# Patient Record
Sex: Male | Born: 1980 | Race: White | Hispanic: No | Marital: Married | Smoking: Never smoker
Health system: Southern US, Community
[De-identification: ages and names within clinical notes are randomized; demographics above are authoritative.]

## PROBLEM LIST (undated history)

## (undated) DIAGNOSIS — J309 Allergic rhinitis, unspecified: Secondary | ICD-10-CM

## (undated) DIAGNOSIS — K219 Gastro-esophageal reflux disease without esophagitis: Secondary | ICD-10-CM

## (undated) DIAGNOSIS — T7840XA Allergy, unspecified, initial encounter: Secondary | ICD-10-CM

## (undated) HISTORY — DX: Gastro-esophageal reflux disease without esophagitis: K21.9

## (undated) HISTORY — PX: HERNIA REPAIR: SHX51

## (undated) HISTORY — DX: Allergy, unspecified, initial encounter: T78.40XA

## (undated) HISTORY — DX: Allergic rhinitis, unspecified: J30.9

## (undated) HISTORY — PX: ROTATOR CUFF REPAIR: SHX139

---

## 2016-09-30 ENCOUNTER — Emergency Department (HOSPITAL_BASED_OUTPATIENT_CLINIC_OR_DEPARTMENT_OTHER)
Admission: EM | Admit: 2016-09-30 | Discharge: 2016-09-30 | Disposition: A | Discharge status: Home / Self Care | Payer: No Typology Code available for payment source | Attending: Emergency Medicine | Admitting: Emergency Medicine

## 2016-09-30 ENCOUNTER — Emergency Department (HOSPITAL_BASED_OUTPATIENT_CLINIC_OR_DEPARTMENT_OTHER): Payer: No Typology Code available for payment source

## 2016-09-30 ENCOUNTER — Encounter (HOSPITAL_BASED_OUTPATIENT_CLINIC_OR_DEPARTMENT_OTHER): Payer: Self-pay | Admitting: Emergency Medicine

## 2016-09-30 DIAGNOSIS — S161XXA Strain of muscle, fascia and tendon at neck level, initial encounter: Secondary | ICD-10-CM | POA: Diagnosis not present

## 2016-09-30 DIAGNOSIS — Y92410 Unspecified street and highway as the place of occurrence of the external cause: Secondary | ICD-10-CM | POA: Insufficient documentation

## 2016-09-30 DIAGNOSIS — S39012A Strain of muscle, fascia and tendon of lower back, initial encounter: Secondary | ICD-10-CM | POA: Insufficient documentation

## 2016-09-30 DIAGNOSIS — Y9389 Activity, other specified: Secondary | ICD-10-CM | POA: Insufficient documentation

## 2016-09-30 DIAGNOSIS — Y999 Unspecified external cause status: Secondary | ICD-10-CM | POA: Diagnosis not present

## 2016-09-30 DIAGNOSIS — S199XXA Unspecified injury of neck, initial encounter: Secondary | ICD-10-CM | POA: Diagnosis present

## 2016-09-30 MED ORDER — CYCLOBENZAPRINE HCL 10 MG PO TABS: 10.0000 mg | ORAL_TABLET | Freq: Two times a day (BID) | ORAL | 0 refills | Status: DC | PRN

## 2016-09-30 NOTE — ED Notes (Signed)
Bacitracin dressing to his left wrist
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
## 2016-09-30 NOTE — Discharge Instructions (Signed)
Ibuprofen 600 mg every 6 hours as needed for pain.  Flexeril as prescribed as needed for pain not relieved with ibuprofen.  All with your primary Dr. if you're not improving in the next week.
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
## 2024-07-11 NOTE — Progress Notes (Unsigned)
 "     Office Note 07/12/2024  CC:  Chief Complaint  Patient presents with   Annual Exam    Pt is fasting   HPI:  Eddie Whitney is a 44 y.o. male who is here to establish care, cpe. Patient's most recent primary MD: None Old records were not available for review prior to or during today's visit.  Brown spot on left side of scalp getting darker lately.  He notes his resting heart rate is increased to the 90-105 range a couple of times a month.  No palpitations, shortness of breath, dizziness, or chest pains.  On 1 of these occasions he was sitting in church and noticed on his watch that his rate was 104 and he felt a vague sick feeling but it was brief.  He does not do any formal exercise, says his work hours are too long, no time. His diet is fairly healthy. He owns a therapist, music.  He has a skin tag in the left armpit and 1 on the right upper inner thigh that he would like removed.  He has several years of gradually progressive gastroesophageal reflux.  A couple of nights a week he wakes up in the night with significant symptoms.  He takes over-the-counter Pepcid 10 mg twice a day usually.  Sometimes takes Tums (has a bottle of it by his bedside). He does identify some foods as culprits, also says when he eats later in the evening it happens more.  Past Medical History:  Diagnosis Date   Allergic rhinitis    GERD (gastroesophageal reflux disease)     Past Surgical History:  Procedure Laterality Date   HERNIA REPAIR     ROTATOR CUFF REPAIR     2015    History reviewed. No pertinent family history.  Social History   Socioeconomic History   Marital status: Married    Spouse name: Not on file   Number of children: Not on file   Years of education: Not on file   Highest education level: Bachelor's degree (e.g., BA, AB, BS)  Occupational History   Not on file  Tobacco Use   Smoking status: Never   Smokeless tobacco: Never  Substance and Sexual Activity   Alcohol  use: No   Drug use: No   Sexual activity: Not on file  Other Topics Concern   Not on file  Social History Narrative   Married,   Financial Controller   Social Drivers of Health   Tobacco Use: Low Risk (07/12/2024)   Patient History    Smoking Tobacco Use: Never    Smokeless Tobacco Use: Never    Passive Exposure: Not on file  Financial Resource Strain: Low Risk (07/08/2024)   Overall Financial Resource Strain (CARDIA)    Difficulty of Paying Living Expenses: Not hard at all  Food Insecurity: No Food Insecurity (07/08/2024)   Epic    Worried About Radiation Protection Practitioner of Food in the Last Year: Never true    Ran Out of Food in the Last Year: Never true  Transportation Needs: No Transportation Needs (07/08/2024)   Epic    Lack of Transportation (Medical): No    Lack of Transportation (Non-Medical): No  Physical Activity: Inactive (07/08/2024)   Exercise Vital Sign    Days of Exercise per Week: 0 days    Minutes of Exercise per Session: Not on file  Stress: No Stress Concern Present (07/08/2024)   [employer] of Occupational Health - Occupational Stress Questionnaire  Feeling of Stress: Only a little  Social Connections: Socially Integrated (07/08/2024)   Social Connection and Isolation Panel    Frequency of Communication with Friends and Family: Twice a week    Frequency of Social Gatherings with Friends and Family: Once a week    Attends Religious Services: More than 4 times per year    Active Member of Clubs or Organizations: Yes    Attends Banker Meetings: More than 4 times per year    Marital Status: Married  Catering Manager Violence: Not on file  Depression (PHQ2-9): Low Risk (07/12/2024)   Depression (PHQ2-9)    PHQ-2 Score: 0  Alcohol Screen: Low Risk (07/08/2024)   Alcohol Screen    Last Alcohol Screening Score (AUDIT): 1  Housing: Low Risk (07/08/2024)   Epic    Unable to Pay for Housing in the Last Year: No    Number of Times Moved in the Last  Year: 0    Homeless in the Last Year: No  Utilities: Not on file  Health Literacy: Not on file    Outpatient Encounter Medications as of 07/12/2024  Medication Sig   famotidine (PEPCID) 10 MG tablet Take 10 mg by mouth 2 (two) times daily.   loratadine-pseudoephedrine (CLARITIN-D 12-HOUR) 5-120 MG tablet Take 1 tablet by mouth 2 (two) times daily.   omeprazole (PRILOSEC) 40 MG capsule Take 1 capsule (40 mg total) by mouth daily as needed.   [DISCONTINUED] fexofenadine (ALLEGRA) 180 MG tablet Take 180 mg by mouth daily.   [DISCONTINUED] cyclobenzaprine  (FLEXERIL ) 10 MG tablet Take 1 tablet (10 mg total) by mouth 2 (two) times daily as needed for muscle spasms.   No facility-administered encounter medications on file as of 07/12/2024.    Allergies[1]  Review of Systems  Constitutional:  Negative for appetite change, chills, fatigue and fever.  HENT:  Negative for congestion, dental problem, ear pain and sore throat.   Eyes:  Negative for discharge, redness and visual disturbance.  Respiratory:  Negative for cough, chest tightness, shortness of breath and wheezing.   Cardiovascular:  Negative for chest pain, palpitations and leg swelling.  Gastrointestinal:  Negative for abdominal pain, blood in stool, diarrhea, nausea and vomiting.  Genitourinary:  Negative for difficulty urinating, dysuria, flank pain, frequency, hematuria and urgency.  Musculoskeletal:  Negative for arthralgias, back pain, joint swelling, myalgias and neck stiffness.  Skin:  Negative for pallor and rash.  Neurological:  Negative for dizziness, speech difficulty, weakness and headaches.  Hematological:  Negative for adenopathy. Does not bruise/bleed easily.  Psychiatric/Behavioral:  Negative for confusion and sleep disturbance. The patient is not nervous/anxious.     PE; Blood pressure 113/78, pulse 72, temperature 98 F (36.7 C), height 5' 9.69 (1.77 m), weight 207 lb 3.2 oz (94 kg), SpO2 98%. Body mass index is 30  kg/m.  Physical Exam  Gen: Alert, well appearing.  Patient is oriented to person, place, time, and situation. AFFECT: pleasant, lucid thought and speech. ENT: Ears: EACs clear, normal epithelium.  TMs with good light reflex and landmarks bilaterally.  Eyes: no injection, icteris, swelling, or exudate.  EOMI, PERRLA. Nose: no drainage or turbinate edema/swelling.  No injection or focal lesion.  Mouth: lips without lesion/swelling.  Oral mucosa pink and moist.  Dentition intact and without obvious caries or gingival swelling.  Oropharynx without erythema, exudate, or swelling.  Neck: supple/nontender.  No LAD, mass, or TM.  Carotid pulses 2+ bilaterally, without bruits. CV: RRR, no m/r/g.   LUNGS: CTA  bilat, nonlabored resps, good aeration in all lung fields. ABD: soft, NT, ND, BS normal.  No hepatospenomegaly or mass.  No bruits. EXT: no clubbing, cyanosis, or edema.  Musculoskeletal: no joint swelling, erythema, warmth, or tenderness.  ROM of all joints intact. Skin -left frontal scalp with light brown irregular shaped macule about 3 mm in size. Small skin tag in left armpit.  Approximately 1 cm skin tag in right groin crease.  Pertinent labs:  None  ASSESSMENT AND PLAN:   New patient, establishing care.  #1 health maintenance exam: Reviewed age and gender appropriate health maintenance issues (prudent diet, regular exercise, health risks of tobacco and excessive alcohol, use of seatbelts, fire alarms in home, use of sunscreen).  Also reviewed age and gender appropriate health screening as well as vaccine recommendations. Vaccines: Tdap-->utd 2017 Labs: fasting HP labs ordered Prostate ca screening: average risk patient= as per latest guidelines, start screening at 50 yrs of age. Colon ca screening: average risk patient= as per latest guidelines, start screening at 64 yrs of age.  Discussed options today-->GI referral today.  #2 Pigmented skin lesion of scalp/needs skin cancer  screening. Refer to dermatology.  3.  Increased resting heart rate. Reassured.  Monitor. Check TSH today.  #4 skin tags.  He will return for the removal of the left axillary skin tag in the left groin crease skin tag.  #5 GERD. Start omeprazole 40 mg daily as needed.  Okay to stop H2 blocker.  An After Visit Summary was printed and given to the patient.  Return for appt at your convenience for skin tag removal.  Signed:  Gerlene Hockey, MD           07/12/2024      [1]  Allergies Allergen Reactions   Codeine Rash   "

## 2024-07-12 ENCOUNTER — Encounter: Payer: Self-pay | Admitting: Family Medicine

## 2024-07-12 ENCOUNTER — Ambulatory Visit: Payer: Self-pay | Admitting: Family Medicine

## 2024-07-12 VITALS — BP 113/78 | HR 72 | Temp 98.0°F | Ht 69.69 in | Wt 207.2 lb

## 2024-07-12 DIAGNOSIS — R000 Tachycardia, unspecified: Secondary | ICD-10-CM

## 2024-07-12 DIAGNOSIS — K219 Gastro-esophageal reflux disease without esophagitis: Secondary | ICD-10-CM

## 2024-07-12 DIAGNOSIS — Z0000 Encounter for general adult medical examination without abnormal findings: Secondary | ICD-10-CM

## 2024-07-12 DIAGNOSIS — L918 Other hypertrophic disorders of the skin: Secondary | ICD-10-CM

## 2024-07-12 DIAGNOSIS — Z1283 Encounter for screening for malignant neoplasm of skin: Secondary | ICD-10-CM

## 2024-07-12 DIAGNOSIS — Z1211 Encounter for screening for malignant neoplasm of colon: Secondary | ICD-10-CM

## 2024-07-12 DIAGNOSIS — E669 Obesity, unspecified: Secondary | ICD-10-CM | POA: Insufficient documentation

## 2024-07-12 LAB — LIPID PANEL
Cholesterol: 188 mg/dL (ref 28–200)
HDL: 43.8 mg/dL
LDL Cholesterol: 124 mg/dL — ABNORMAL HIGH (ref 10–99)
NonHDL: 144.69
Total CHOL/HDL Ratio: 4
Triglycerides: 101 mg/dL (ref 10.0–149.0)
VLDL: 20.2 mg/dL (ref 0.0–40.0)

## 2024-07-12 LAB — CBC WITH DIFFERENTIAL/PLATELET
Basophils Absolute: 0.1 K/uL (ref 0.0–0.1)
Basophils Relative: 1.3 % (ref 0.0–3.0)
Eosinophils Absolute: 0.1 K/uL (ref 0.0–0.7)
Eosinophils Relative: 2.2 % (ref 0.0–5.0)
HCT: 49.2 % (ref 39.0–52.0)
Hemoglobin: 16.4 g/dL (ref 13.0–17.0)
Lymphocytes Relative: 27.7 % (ref 12.0–46.0)
Lymphs Abs: 1.4 K/uL (ref 0.7–4.0)
MCHC: 33.3 g/dL (ref 30.0–36.0)
MCV: 93.1 fl (ref 78.0–100.0)
Monocytes Absolute: 0.5 K/uL (ref 0.1–1.0)
Monocytes Relative: 9.8 % (ref 3.0–12.0)
Neutro Abs: 2.9 K/uL (ref 1.4–7.7)
Neutrophils Relative %: 59 % (ref 43.0–77.0)
Platelets: 185 K/uL (ref 150.0–400.0)
RBC: 5.28 Mil/uL (ref 4.22–5.81)
RDW: 13.7 % (ref 11.5–15.5)
WBC: 4.9 K/uL (ref 4.0–10.5)

## 2024-07-12 LAB — COMPREHENSIVE METABOLIC PANEL WITH GFR
ALT: 20 U/L (ref 3–53)
AST: 17 U/L (ref 5–37)
Albumin: 4.8 g/dL (ref 3.5–5.2)
Alkaline Phosphatase: 41 U/L (ref 39–117)
BUN: 18 mg/dL (ref 6–23)
CO2: 31 meq/L (ref 19–32)
Calcium: 10.2 mg/dL (ref 8.4–10.5)
Chloride: 102 meq/L (ref 96–112)
Creatinine, Ser: 1.21 mg/dL (ref 0.40–1.50)
GFR: 72.39 mL/min
Glucose, Bld: 83 mg/dL (ref 70–99)
Potassium: 4.5 meq/L (ref 3.5–5.1)
Sodium: 139 meq/L (ref 135–145)
Total Bilirubin: 0.6 mg/dL (ref 0.2–1.2)
Total Protein: 7.2 g/dL (ref 6.0–8.3)

## 2024-07-12 LAB — TSH: TSH: 1.01 u[IU]/mL (ref 0.35–5.50)

## 2024-07-12 MED ORDER — OMEPRAZOLE 40 MG PO CPDR: 40.0000 mg | DELAYED_RELEASE_CAPSULE | Freq: Every day | ORAL | 3 refills | Status: AC | PRN

## 2024-07-12 NOTE — Patient Instructions (Signed)
 Health Maintenance, Male  Adopting a healthy lifestyle and getting preventive care are important in promoting health and wellness. Ask your health care provider about:  The right schedule for you to have regular tests and exams.  Things you can do on your own to prevent diseases and keep yourself healthy.  What should I know about diet, weight, and exercise?  Eat a healthy diet    Eat a diet that includes plenty of vegetables, fruits, low-fat dairy products, and lean protein.  Do not eat a lot of foods that are high in solid fats, added sugars, or sodium.  Maintain a healthy weight  Body mass index (BMI) is a measurement that can be used to identify possible weight problems. It estimates body fat based on height and weight. Your health care provider can help determine your BMI and help you achieve or maintain a healthy weight.  Get regular exercise  Get regular exercise. This is one of the most important things you can do for your health. Most adults should:  Exercise for at least 150 minutes each week. The exercise should increase your heart rate and make you sweat (moderate-intensity exercise).  Do strengthening exercises at least twice a week. This is in addition to the moderate-intensity exercise.  Spend less time sitting. Even light physical activity can be beneficial.  Watch cholesterol and blood lipids  Have your blood tested for lipids and cholesterol at 46 years of age, then have this test every 5 years.  You may need to have your cholesterol levels checked more often if:  Your lipid or cholesterol levels are high.  You are older than 46 years of age.  You are at high risk for heart disease.  What should I know about cancer screening?  Many types of cancers can be detected early and may often be prevented. Depending on your health history and family history, you may need to have cancer screening at various ages. This may include screening for:  Colorectal cancer.  Prostate cancer.  Skin cancer.  Lung  cancer.  What should I know about heart disease, diabetes, and high blood pressure?  Blood pressure and heart disease  High blood pressure causes heart disease and increases the risk of stroke. This is more likely to develop in people who have high blood pressure readings or are overweight.  Talk with your health care provider about your target blood pressure readings.  Have your blood pressure checked:  Every 3-5 years if you are 24-52 years of age.  Every year if you are 3 years old or older.  If you are between the ages of 60 and 72 and are a current or former smoker, ask your health care provider if you should have a one-time screening for abdominal aortic aneurysm (AAA).  Diabetes  Have regular diabetes screenings. This checks your fasting blood sugar level. Have the screening done:  Once every three years after age 66 if you are at a normal weight and have a low risk for diabetes.  More often and at a younger age if you are overweight or have a high risk for diabetes.  What should I know about preventing infection?  Hepatitis B  If you have a higher risk for hepatitis B, you should be screened for this virus. Talk with your health care provider to find out if you are at risk for hepatitis B infection.  Hepatitis C  Blood testing is recommended for:  Everyone born from [birth year] through 1967.  Anyone  with known risk factors for hepatitis C.  Sexually transmitted infections (STIs)  You should be screened each year for STIs, including gonorrhea and chlamydia, if:  You are sexually active and are younger than 46 years of age.  You are older than 46 years of age and your health care provider tells you that you are at risk for this type of infection.  Your sexual activity has changed since you were last screened, and you are at increased risk for chlamydia or gonorrhea. Ask your health care provider if you are at risk.  Ask your health care provider about whether you are at high risk for HIV. Your health care provider  may recommend a prescription medicine to help prevent HIV infection. If you choose to take medicine to prevent HIV, you should first get tested for HIV. You should then be tested every 3 months for as long as you are taking the medicine.  Follow these instructions at home:  Alcohol use  Do not drink alcohol if your health care provider tells you not to drink.  If you drink alcohol:  Limit how much you have to 0-2 drinks a day.  Know how much alcohol is in your drink. In the [place] one drink equals one 12 oz bottle of beer (355 mL), one 5 oz glass of wine (148 mL), or one 1 oz glass of hard liquor (44 mL).  Lifestyle  Do not use any products that contain nicotine or tobacco. These products include cigarettes, chewing tobacco, and vaping devices, such as e-cigarettes. If you need help quitting, ask your health care provider.  Do not use street drugs.  Do not share needles.  Ask your health care provider for help if you need support or information about quitting drugs.  General instructions  Schedule regular health, dental, and eye exams.  Stay current with your vaccines.  Tell your health care provider if:  You often feel depressed.  You have ever been abused or do not feel safe at home.  Summary  Adopting a healthy lifestyle and getting preventive care are important in promoting health and wellness.  Follow your health care provider's instructions about healthy diet, exercising, and getting tested or screened for diseases.  Follow your health care provider's instructions on monitoring your cholesterol and blood pressure.  This information is not intended to replace advice given to you by your health care provider. Make sure you discuss any questions you have with your health care provider.  Document Revised: 11/16/2020 Document Reviewed: 11/16/2020  Elsevier Patient Education  2024 [employer].

## 2024-07-13 ENCOUNTER — Ambulatory Visit: Payer: Self-pay | Admitting: Family Medicine

## 2024-07-29 ENCOUNTER — Ambulatory Visit: Payer: Self-pay | Admitting: Family Medicine

## 2024-07-29 ENCOUNTER — Encounter: Payer: Self-pay | Admitting: Family Medicine

## 2024-07-29 VITALS — BP 114/76 | HR 76 | Temp 97.7°F | Ht 69.69 in | Wt 202.8 lb

## 2024-07-29 DIAGNOSIS — L918 Other hypertrophic disorders of the skin: Secondary | ICD-10-CM

## 2024-07-29 NOTE — Progress Notes (Signed)
 OFFICE NOTE  07/29/2024  CC:  Chief Complaint  Patient presents with   Procedure    Skin tag removal   HPI: Patient is a 44 y.o.  male who is here for skin tag removal. Several asymptomatic skin tags in the left axillary region and 1 larger skin tag in the left groin crease. Patient desires removal.  MEDS:  Outpatient Medications Prior to Visit  Medication Sig Dispense Refill   famotidine (PEPCID) 10 MG tablet Take 10 mg by mouth 2 (two) times daily.     loratadine-pseudoephedrine (CLARITIN-D 12-HOUR) 5-120 MG tablet Take 1 tablet by mouth 2 (two) times daily.     omeprazole  (PRILOSEC) 40 MG capsule Take 1 capsule (40 mg total) by mouth daily as needed. 30 capsule 3   No facility-administered medications prior to visit.   PE: Blood pressure 114/76, pulse 76, temperature 97.7 F (36.5 C), temperature source Temporal, height 5' 9.69 (1.77 m), weight 202 lb 12.8 oz (92 kg), SpO2 98%. Gen: Alert, well appearing.  Patient is oriented to person, place, time, and situation. L axilla with one 2 mm acrochordon R axilla with one 2 mm acrochordon Inner R thigh with one 8 mm acrochordon  IMPRESSION AND PLAN: Acrochondon/skin tags--->removed in office today (one axilla R 2mm, one axilla L 2mm, and one inner R thigh 8 mm).  Procedure: skin lesion removal.  Consent obtained.  Area prepped and draped after being infiltrated with 1% lido with epi.  Sterile technique utilized to excise the lesion entirely by using elliptical excision technique.  Tiny wound divot at each site, no bleeding.  No closure necessary.  No immediate complications.  Pt tolerated procedure well. No specimens were sent to pathology. Wound care instructions discussed.   An After Visit Summary was printed and given to the patient.  FOLLOW UP: As needed   Signed:  Gerlene Hockey, MD           07/29/2024

## 2024-08-14 ENCOUNTER — Encounter: Payer: Self-pay | Admitting: Gastroenterology

## 2024-09-12 ENCOUNTER — Encounter: Payer: Self-pay | Admitting: Gastroenterology
